# Patient Record
Sex: Male | Born: 1935 | Race: White | Hispanic: No | Marital: Married
Health system: Southern US, Community
[De-identification: ages and names within clinical notes are randomized; demographics above are authoritative.]

---

## 2004-12-05 ENCOUNTER — Inpatient Hospital Stay: Payer: Self-pay | Admitting: Internal Medicine

## 2004-12-05 ENCOUNTER — Other Ambulatory Visit: Payer: Self-pay

## 2005-01-28 ENCOUNTER — Ambulatory Visit: Payer: Self-pay | Admitting: Internal Medicine

## 2005-02-06 ENCOUNTER — Inpatient Hospital Stay: Payer: Self-pay | Admitting: Internal Medicine

## 2006-04-10 ENCOUNTER — Ambulatory Visit: Payer: Self-pay | Admitting: Internal Medicine

## 2006-10-29 ENCOUNTER — Ambulatory Visit: Payer: Self-pay | Admitting: Specialist

## 2007-05-06 ENCOUNTER — Ambulatory Visit: Payer: Self-pay | Admitting: Gastroenterology

## 2011-01-07 ENCOUNTER — Ambulatory Visit: Payer: Self-pay | Admitting: Internal Medicine

## 2011-01-14 ENCOUNTER — Emergency Department: Payer: Self-pay | Admitting: Emergency Medicine

## 2011-01-20 ENCOUNTER — Ambulatory Visit: Payer: Self-pay | Admitting: Unknown Physician Specialty

## 2011-02-18 ENCOUNTER — Ambulatory Visit: Payer: Self-pay | Admitting: Pain Medicine

## 2011-02-26 ENCOUNTER — Ambulatory Visit: Payer: Self-pay | Admitting: Pain Medicine

## 2011-03-11 ENCOUNTER — Ambulatory Visit: Payer: Self-pay | Admitting: Pain Medicine

## 2011-03-19 ENCOUNTER — Ambulatory Visit: Payer: Self-pay | Admitting: Pain Medicine

## 2011-03-28 ENCOUNTER — Inpatient Hospital Stay: Payer: Self-pay | Admitting: Specialist

## 2011-04-03 ENCOUNTER — Ambulatory Visit: Payer: Self-pay | Admitting: Pain Medicine

## 2011-05-01 ENCOUNTER — Ambulatory Visit: Payer: Self-pay | Admitting: Internal Medicine

## 2011-05-21 ENCOUNTER — Ambulatory Visit: Payer: Self-pay | Admitting: Internal Medicine

## 2011-05-24 ENCOUNTER — Ambulatory Visit: Payer: Self-pay | Admitting: Unknown Physician Specialty

## 2013-06-29 ENCOUNTER — Ambulatory Visit: Payer: Self-pay

## 2013-07-08 IMAGING — CR DG CHEST 2V
1 series · 4 of 4 positions shown · non-contrast
Comparison: none

REASON FOR EXAM: shortness of breath
COMMENTS:

[Series 1: pa · 0.17mm/px · 4 of 4 slices shown]
[im 1/4]
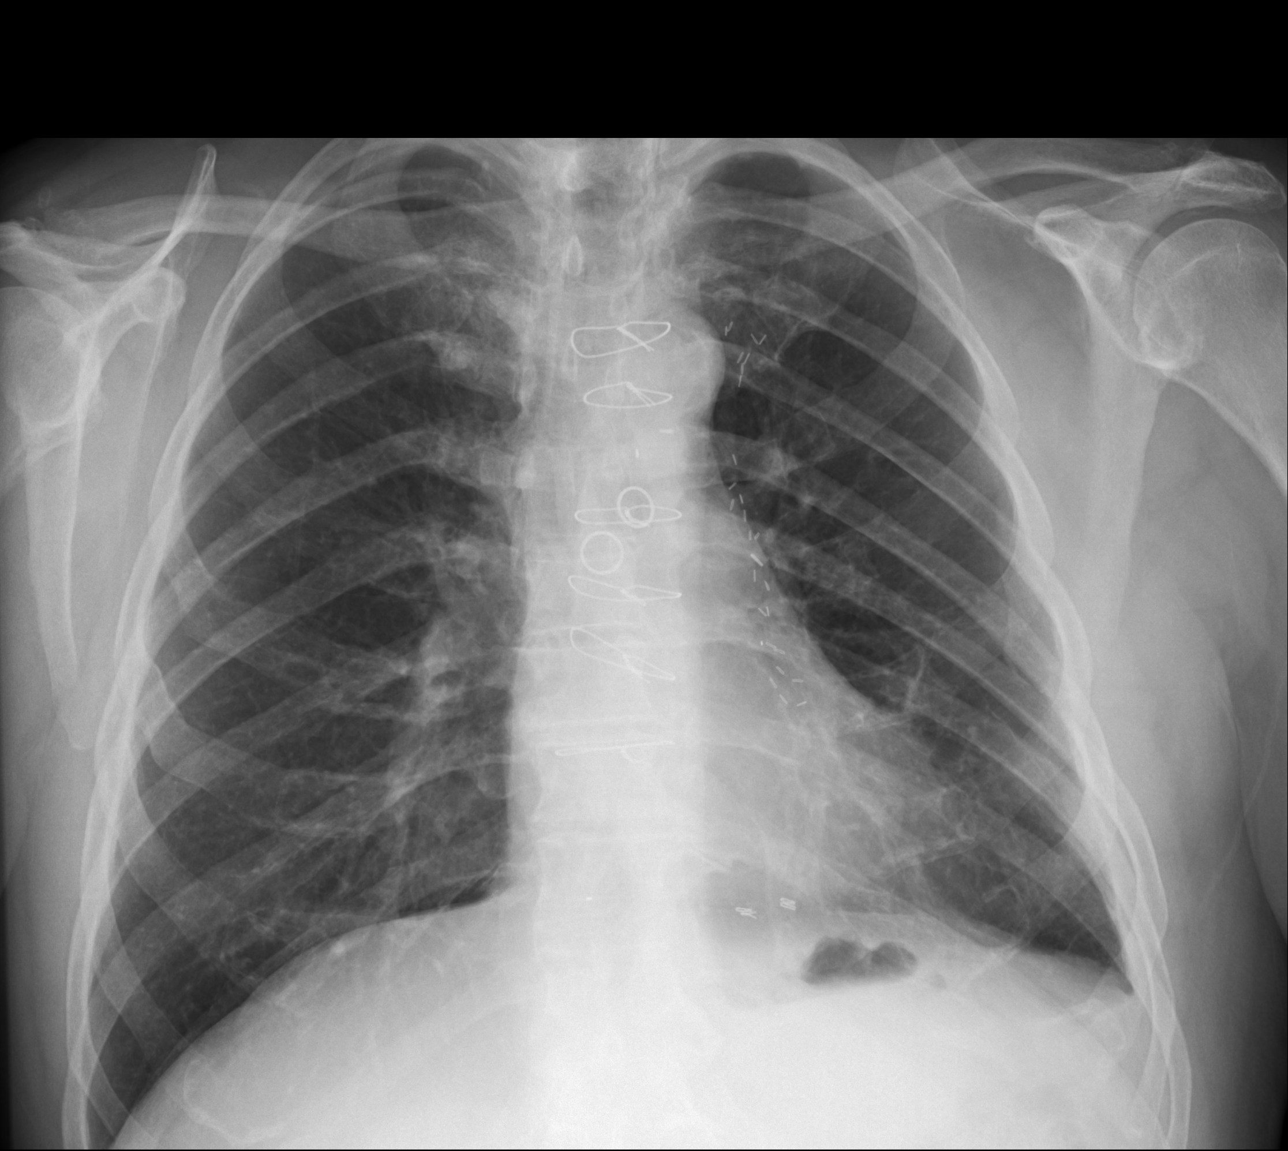
[im 2/4]
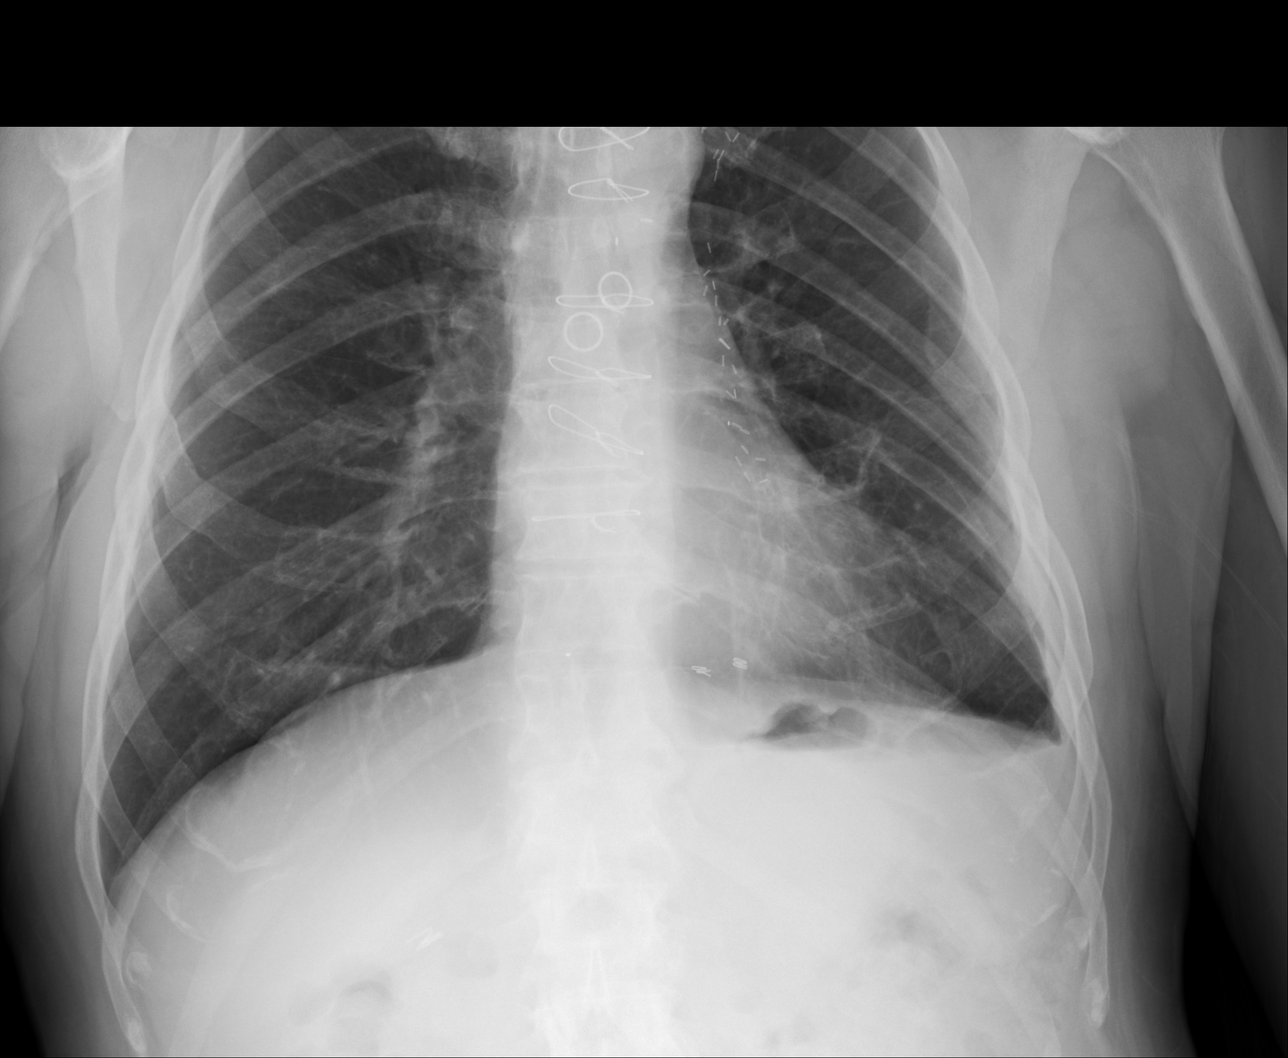
[im 3/4]
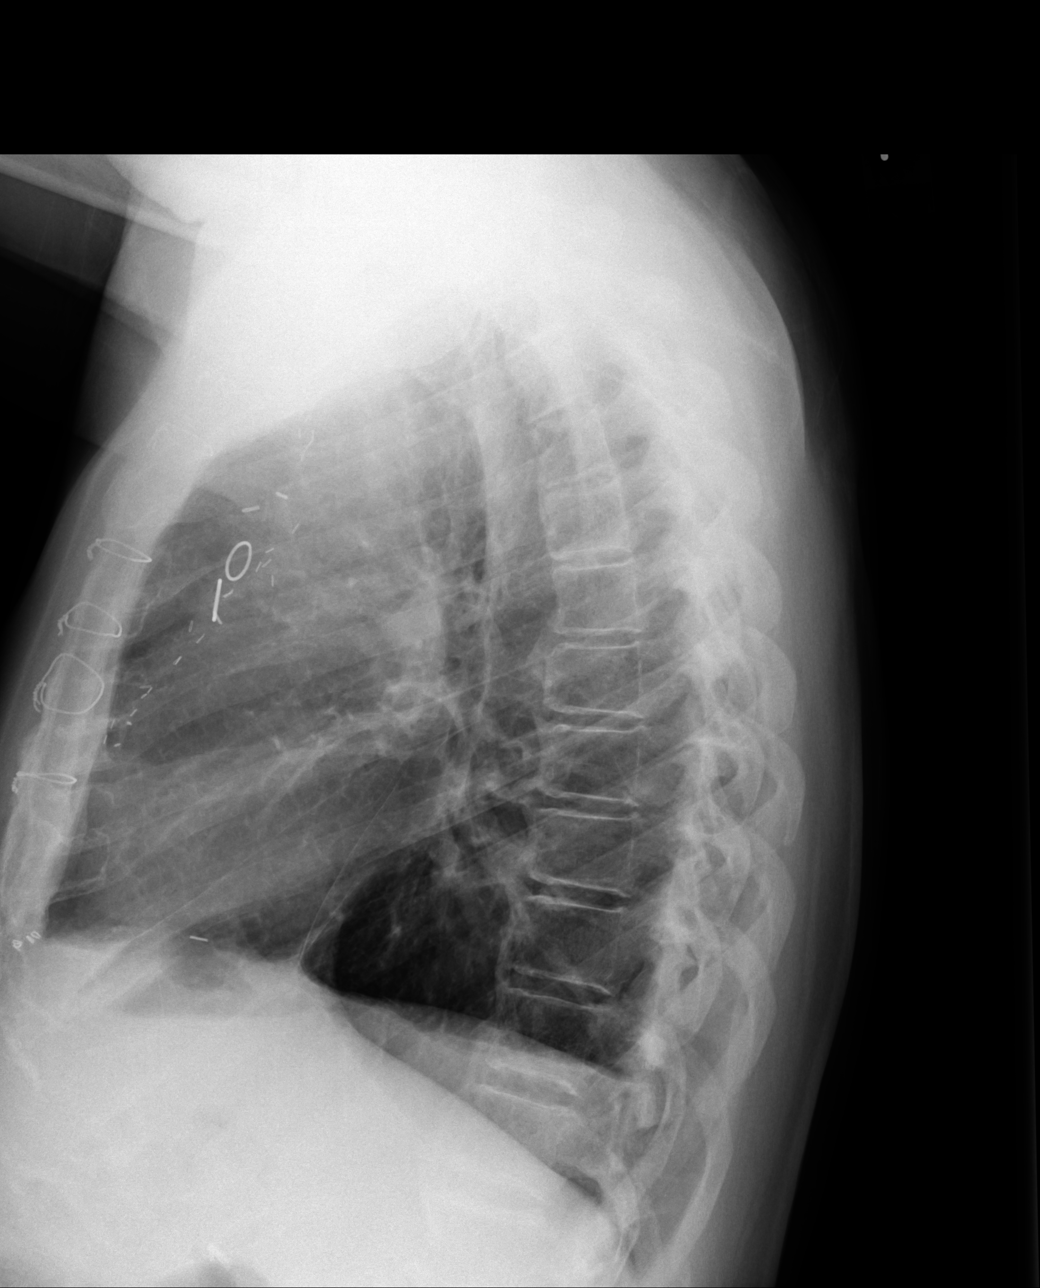
[im 4/4]
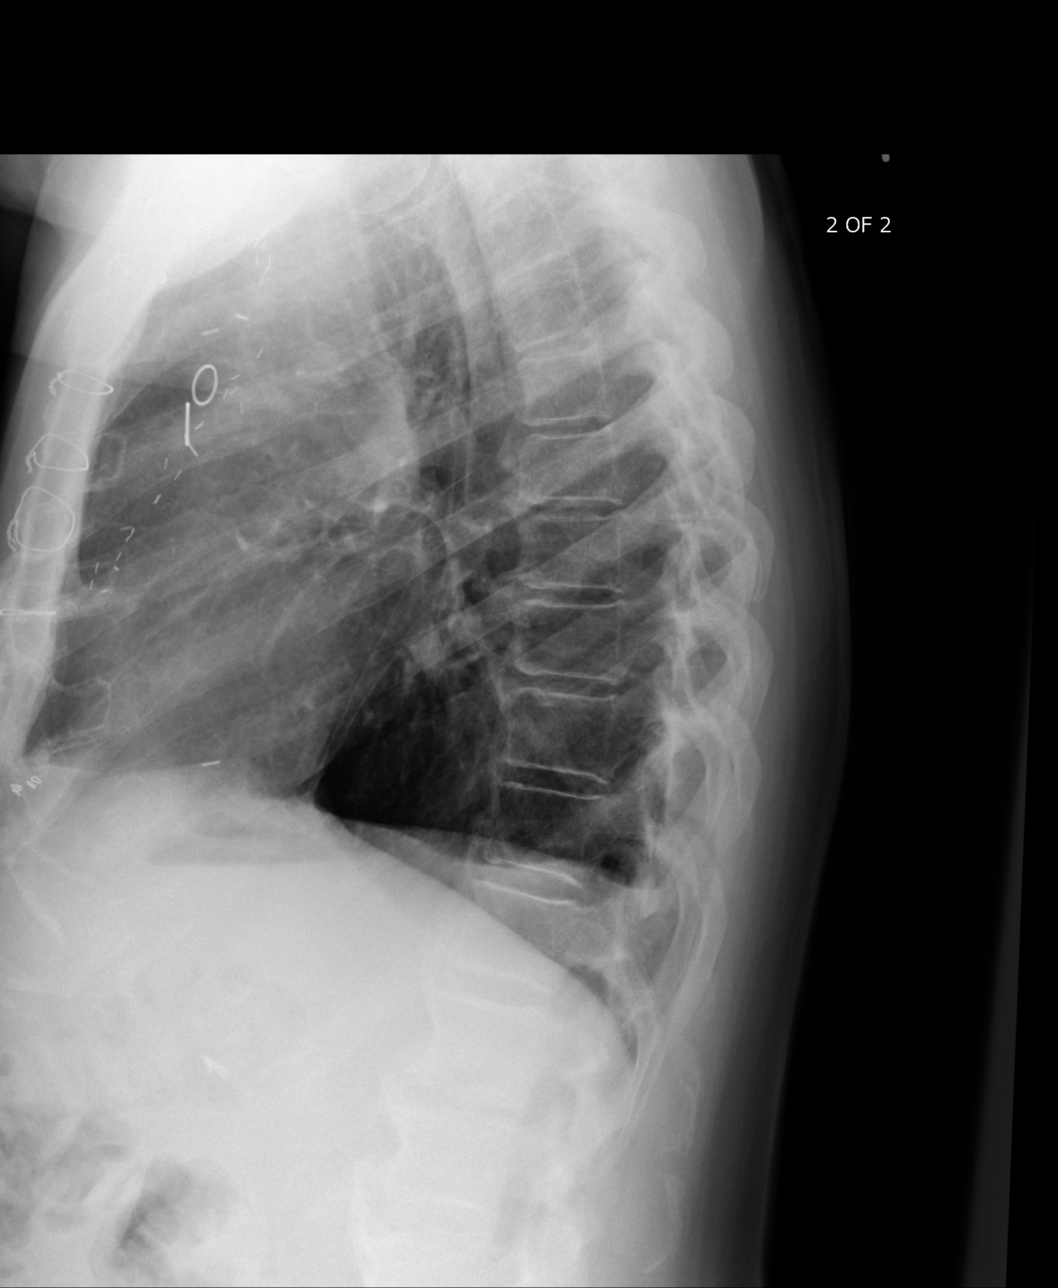

[4 of 4 positions shown; findings below may reference images not displayed]

PROCEDURE:     DXR - DXR CHEST PA (OR AP) AND LATERAL  - June 29, 2013  [DATE]

RESULT:     Comparison is made to a study dated May 21, 2011.

The right lung is well-expanded and clear. On the left there is stable
mildly increased density likely related to post CABG changes. There is no
significant pleural fluid collection. The cardiac silhouette is normal in
size. The mediastinum is normal in width. The pulmonary vascularity is not
engorged. There is no pneumothorax or pneumomediastinum.
IMPRESSION: The findings are consistent with COPD and post CABG
changes. There is no evidence of pneumonia.

[REDACTED]

## 2016-02-23 DIAGNOSIS — E1169 Type 2 diabetes mellitus with other specified complication: Secondary | ICD-10-CM | POA: Diagnosis not present

## 2016-02-23 DIAGNOSIS — Z0000 Encounter for general adult medical examination without abnormal findings: Secondary | ICD-10-CM | POA: Diagnosis not present

## 2016-02-23 DIAGNOSIS — E1140 Type 2 diabetes mellitus with diabetic neuropathy, unspecified: Secondary | ICD-10-CM | POA: Diagnosis not present

## 2016-02-23 DIAGNOSIS — K219 Gastro-esophageal reflux disease without esophagitis: Secondary | ICD-10-CM | POA: Diagnosis not present

## 2016-02-23 DIAGNOSIS — E039 Hypothyroidism, unspecified: Secondary | ICD-10-CM | POA: Diagnosis not present

## 2016-02-23 DIAGNOSIS — I25119 Atherosclerotic heart disease of native coronary artery with unspecified angina pectoris: Secondary | ICD-10-CM | POA: Diagnosis not present

## 2016-02-23 DIAGNOSIS — E782 Mixed hyperlipidemia: Secondary | ICD-10-CM | POA: Diagnosis not present

## 2016-02-23 DIAGNOSIS — F419 Anxiety disorder, unspecified: Secondary | ICD-10-CM | POA: Diagnosis not present

## 2016-02-23 DIAGNOSIS — I361 Nonrheumatic tricuspid (valve) insufficiency: Secondary | ICD-10-CM | POA: Diagnosis not present

## 2016-02-23 DIAGNOSIS — I4519 Other right bundle-branch block: Secondary | ICD-10-CM | POA: Diagnosis not present

## 2016-02-23 DIAGNOSIS — E1122 Type 2 diabetes mellitus with diabetic chronic kidney disease: Secondary | ICD-10-CM | POA: Diagnosis not present

## 2016-02-23 DIAGNOSIS — H409 Unspecified glaucoma: Secondary | ICD-10-CM | POA: Diagnosis not present

## 2016-02-23 DIAGNOSIS — N189 Chronic kidney disease, unspecified: Secondary | ICD-10-CM | POA: Diagnosis not present

## 2016-02-23 DIAGNOSIS — E1165 Type 2 diabetes mellitus with hyperglycemia: Secondary | ICD-10-CM | POA: Diagnosis not present

## 2016-02-23 DIAGNOSIS — E1151 Type 2 diabetes mellitus with diabetic peripheral angiopathy without gangrene: Secondary | ICD-10-CM | POA: Diagnosis not present

## 2018-11-11 DIAGNOSIS — R079 Chest pain, unspecified: Secondary | ICD-10-CM | POA: Diagnosis not present

## 2019-03-17 DIAGNOSIS — I959 Hypotension, unspecified: Secondary | ICD-10-CM | POA: Diagnosis not present

## 2019-03-17 DIAGNOSIS — R5381 Other malaise: Secondary | ICD-10-CM | POA: Diagnosis not present

## 2019-03-17 DIAGNOSIS — R52 Pain, unspecified: Secondary | ICD-10-CM | POA: Diagnosis not present

## 2019-03-17 DIAGNOSIS — R42 Dizziness and giddiness: Secondary | ICD-10-CM | POA: Diagnosis not present

## 2019-03-17 DIAGNOSIS — R069 Unspecified abnormalities of breathing: Secondary | ICD-10-CM | POA: Diagnosis not present

## 2019-04-14 DIAGNOSIS — R079 Chest pain, unspecified: Secondary | ICD-10-CM | POA: Diagnosis not present

## 2019-04-14 DIAGNOSIS — I959 Hypotension, unspecified: Secondary | ICD-10-CM | POA: Diagnosis not present

## 2019-04-14 DIAGNOSIS — R9431 Abnormal electrocardiogram [ECG] [EKG]: Secondary | ICD-10-CM | POA: Diagnosis not present

## 2019-04-14 DIAGNOSIS — E1165 Type 2 diabetes mellitus with hyperglycemia: Secondary | ICD-10-CM | POA: Diagnosis not present

## 2019-07-21 DIAGNOSIS — E1151 Type 2 diabetes mellitus with diabetic peripheral angiopathy without gangrene: Secondary | ICD-10-CM | POA: Diagnosis not present

## 2019-07-21 DIAGNOSIS — I130 Hypertensive heart and chronic kidney disease with heart failure and stage 1 through stage 4 chronic kidney disease, or unspecified chronic kidney disease: Secondary | ICD-10-CM | POA: Diagnosis not present

## 2019-07-21 DIAGNOSIS — M1612 Unilateral primary osteoarthritis, left hip: Secondary | ICD-10-CM | POA: Diagnosis not present

## 2019-07-21 DIAGNOSIS — E1122 Type 2 diabetes mellitus with diabetic chronic kidney disease: Secondary | ICD-10-CM | POA: Diagnosis not present

## 2019-07-21 DIAGNOSIS — N183 Chronic kidney disease, stage 3 (moderate): Secondary | ICD-10-CM | POA: Diagnosis not present

## 2019-07-21 DIAGNOSIS — I480 Paroxysmal atrial fibrillation: Secondary | ICD-10-CM | POA: Diagnosis not present

## 2019-07-21 DIAGNOSIS — I5030 Unspecified diastolic (congestive) heart failure: Secondary | ICD-10-CM | POA: Diagnosis not present

## 2019-07-21 DIAGNOSIS — D469 Myelodysplastic syndrome, unspecified: Secondary | ICD-10-CM | POA: Diagnosis not present

## 2019-07-21 DIAGNOSIS — I2510 Atherosclerotic heart disease of native coronary artery without angina pectoris: Secondary | ICD-10-CM | POA: Diagnosis not present

## 2019-07-23 DIAGNOSIS — I5030 Unspecified diastolic (congestive) heart failure: Secondary | ICD-10-CM | POA: Diagnosis not present

## 2019-07-23 DIAGNOSIS — I130 Hypertensive heart and chronic kidney disease with heart failure and stage 1 through stage 4 chronic kidney disease, or unspecified chronic kidney disease: Secondary | ICD-10-CM | POA: Diagnosis not present

## 2019-07-23 DIAGNOSIS — D469 Myelodysplastic syndrome, unspecified: Secondary | ICD-10-CM | POA: Diagnosis not present

## 2019-07-23 DIAGNOSIS — I480 Paroxysmal atrial fibrillation: Secondary | ICD-10-CM | POA: Diagnosis not present

## 2019-07-23 DIAGNOSIS — N183 Chronic kidney disease, stage 3 (moderate): Secondary | ICD-10-CM | POA: Diagnosis not present

## 2019-07-23 DIAGNOSIS — M1612 Unilateral primary osteoarthritis, left hip: Secondary | ICD-10-CM | POA: Diagnosis not present

## 2019-07-23 DIAGNOSIS — I2510 Atherosclerotic heart disease of native coronary artery without angina pectoris: Secondary | ICD-10-CM | POA: Diagnosis not present

## 2019-07-23 DIAGNOSIS — E1151 Type 2 diabetes mellitus with diabetic peripheral angiopathy without gangrene: Secondary | ICD-10-CM | POA: Diagnosis not present

## 2019-07-23 DIAGNOSIS — E1122 Type 2 diabetes mellitus with diabetic chronic kidney disease: Secondary | ICD-10-CM | POA: Diagnosis not present

## 2019-07-26 DIAGNOSIS — D469 Myelodysplastic syndrome, unspecified: Secondary | ICD-10-CM | POA: Diagnosis not present

## 2019-07-26 DIAGNOSIS — I480 Paroxysmal atrial fibrillation: Secondary | ICD-10-CM | POA: Diagnosis not present

## 2019-07-26 DIAGNOSIS — I5030 Unspecified diastolic (congestive) heart failure: Secondary | ICD-10-CM | POA: Diagnosis not present

## 2019-07-26 DIAGNOSIS — E1122 Type 2 diabetes mellitus with diabetic chronic kidney disease: Secondary | ICD-10-CM | POA: Diagnosis not present

## 2019-07-26 DIAGNOSIS — I2510 Atherosclerotic heart disease of native coronary artery without angina pectoris: Secondary | ICD-10-CM | POA: Diagnosis not present

## 2019-07-26 DIAGNOSIS — M1612 Unilateral primary osteoarthritis, left hip: Secondary | ICD-10-CM | POA: Diagnosis not present

## 2019-07-26 DIAGNOSIS — N183 Chronic kidney disease, stage 3 (moderate): Secondary | ICD-10-CM | POA: Diagnosis not present

## 2019-07-26 DIAGNOSIS — E1151 Type 2 diabetes mellitus with diabetic peripheral angiopathy without gangrene: Secondary | ICD-10-CM | POA: Diagnosis not present

## 2019-07-26 DIAGNOSIS — I130 Hypertensive heart and chronic kidney disease with heart failure and stage 1 through stage 4 chronic kidney disease, or unspecified chronic kidney disease: Secondary | ICD-10-CM | POA: Diagnosis not present

## 2019-08-06 DIAGNOSIS — I2510 Atherosclerotic heart disease of native coronary artery without angina pectoris: Secondary | ICD-10-CM | POA: Diagnosis not present

## 2019-08-06 DIAGNOSIS — M1612 Unilateral primary osteoarthritis, left hip: Secondary | ICD-10-CM | POA: Diagnosis not present

## 2019-08-06 DIAGNOSIS — E1151 Type 2 diabetes mellitus with diabetic peripheral angiopathy without gangrene: Secondary | ICD-10-CM | POA: Diagnosis not present

## 2019-08-06 DIAGNOSIS — I130 Hypertensive heart and chronic kidney disease with heart failure and stage 1 through stage 4 chronic kidney disease, or unspecified chronic kidney disease: Secondary | ICD-10-CM | POA: Diagnosis not present

## 2019-08-06 DIAGNOSIS — I5030 Unspecified diastolic (congestive) heart failure: Secondary | ICD-10-CM | POA: Diagnosis not present

## 2019-08-06 DIAGNOSIS — E1122 Type 2 diabetes mellitus with diabetic chronic kidney disease: Secondary | ICD-10-CM | POA: Diagnosis not present

## 2019-08-06 DIAGNOSIS — D469 Myelodysplastic syndrome, unspecified: Secondary | ICD-10-CM | POA: Diagnosis not present

## 2019-08-06 DIAGNOSIS — N183 Chronic kidney disease, stage 3 (moderate): Secondary | ICD-10-CM | POA: Diagnosis not present

## 2019-08-06 DIAGNOSIS — I480 Paroxysmal atrial fibrillation: Secondary | ICD-10-CM | POA: Diagnosis not present

## 2019-08-10 DIAGNOSIS — D469 Myelodysplastic syndrome, unspecified: Secondary | ICD-10-CM | POA: Diagnosis not present

## 2019-08-10 DIAGNOSIS — M1612 Unilateral primary osteoarthritis, left hip: Secondary | ICD-10-CM | POA: Diagnosis not present

## 2019-08-10 DIAGNOSIS — I130 Hypertensive heart and chronic kidney disease with heart failure and stage 1 through stage 4 chronic kidney disease, or unspecified chronic kidney disease: Secondary | ICD-10-CM | POA: Diagnosis not present

## 2019-08-10 DIAGNOSIS — I480 Paroxysmal atrial fibrillation: Secondary | ICD-10-CM | POA: Diagnosis not present

## 2019-08-10 DIAGNOSIS — E1151 Type 2 diabetes mellitus with diabetic peripheral angiopathy without gangrene: Secondary | ICD-10-CM | POA: Diagnosis not present

## 2019-08-10 DIAGNOSIS — I2510 Atherosclerotic heart disease of native coronary artery without angina pectoris: Secondary | ICD-10-CM | POA: Diagnosis not present

## 2019-08-10 DIAGNOSIS — I5030 Unspecified diastolic (congestive) heart failure: Secondary | ICD-10-CM | POA: Diagnosis not present

## 2019-08-10 DIAGNOSIS — N183 Chronic kidney disease, stage 3 (moderate): Secondary | ICD-10-CM | POA: Diagnosis not present

## 2019-08-10 DIAGNOSIS — E1122 Type 2 diabetes mellitus with diabetic chronic kidney disease: Secondary | ICD-10-CM | POA: Diagnosis not present

## 2019-08-12 DIAGNOSIS — E1151 Type 2 diabetes mellitus with diabetic peripheral angiopathy without gangrene: Secondary | ICD-10-CM | POA: Diagnosis not present

## 2019-08-12 DIAGNOSIS — E1122 Type 2 diabetes mellitus with diabetic chronic kidney disease: Secondary | ICD-10-CM | POA: Diagnosis not present

## 2019-08-12 DIAGNOSIS — I2510 Atherosclerotic heart disease of native coronary artery without angina pectoris: Secondary | ICD-10-CM | POA: Diagnosis not present

## 2019-08-12 DIAGNOSIS — M1612 Unilateral primary osteoarthritis, left hip: Secondary | ICD-10-CM | POA: Diagnosis not present

## 2019-08-12 DIAGNOSIS — N183 Chronic kidney disease, stage 3 (moderate): Secondary | ICD-10-CM | POA: Diagnosis not present

## 2019-08-12 DIAGNOSIS — I5030 Unspecified diastolic (congestive) heart failure: Secondary | ICD-10-CM | POA: Diagnosis not present

## 2019-08-12 DIAGNOSIS — I130 Hypertensive heart and chronic kidney disease with heart failure and stage 1 through stage 4 chronic kidney disease, or unspecified chronic kidney disease: Secondary | ICD-10-CM | POA: Diagnosis not present

## 2019-08-12 DIAGNOSIS — D469 Myelodysplastic syndrome, unspecified: Secondary | ICD-10-CM | POA: Diagnosis not present

## 2019-08-12 DIAGNOSIS — I480 Paroxysmal atrial fibrillation: Secondary | ICD-10-CM | POA: Diagnosis not present

## 2019-08-20 DIAGNOSIS — I480 Paroxysmal atrial fibrillation: Secondary | ICD-10-CM | POA: Diagnosis not present

## 2019-08-20 DIAGNOSIS — I5030 Unspecified diastolic (congestive) heart failure: Secondary | ICD-10-CM | POA: Diagnosis not present

## 2019-08-20 DIAGNOSIS — N183 Chronic kidney disease, stage 3 (moderate): Secondary | ICD-10-CM | POA: Diagnosis not present

## 2019-08-20 DIAGNOSIS — M1612 Unilateral primary osteoarthritis, left hip: Secondary | ICD-10-CM | POA: Diagnosis not present

## 2019-08-20 DIAGNOSIS — E1122 Type 2 diabetes mellitus with diabetic chronic kidney disease: Secondary | ICD-10-CM | POA: Diagnosis not present

## 2019-08-20 DIAGNOSIS — E1151 Type 2 diabetes mellitus with diabetic peripheral angiopathy without gangrene: Secondary | ICD-10-CM | POA: Diagnosis not present

## 2019-08-20 DIAGNOSIS — D469 Myelodysplastic syndrome, unspecified: Secondary | ICD-10-CM | POA: Diagnosis not present

## 2019-08-20 DIAGNOSIS — I2510 Atherosclerotic heart disease of native coronary artery without angina pectoris: Secondary | ICD-10-CM | POA: Diagnosis not present

## 2019-08-20 DIAGNOSIS — I130 Hypertensive heart and chronic kidney disease with heart failure and stage 1 through stage 4 chronic kidney disease, or unspecified chronic kidney disease: Secondary | ICD-10-CM | POA: Diagnosis not present

## 2019-08-24 DIAGNOSIS — D469 Myelodysplastic syndrome, unspecified: Secondary | ICD-10-CM | POA: Diagnosis not present

## 2019-08-24 DIAGNOSIS — M1612 Unilateral primary osteoarthritis, left hip: Secondary | ICD-10-CM | POA: Diagnosis not present

## 2019-08-24 DIAGNOSIS — E1122 Type 2 diabetes mellitus with diabetic chronic kidney disease: Secondary | ICD-10-CM | POA: Diagnosis not present

## 2019-08-24 DIAGNOSIS — I130 Hypertensive heart and chronic kidney disease with heart failure and stage 1 through stage 4 chronic kidney disease, or unspecified chronic kidney disease: Secondary | ICD-10-CM | POA: Diagnosis not present

## 2019-08-24 DIAGNOSIS — N183 Chronic kidney disease, stage 3 (moderate): Secondary | ICD-10-CM | POA: Diagnosis not present

## 2019-08-24 DIAGNOSIS — I480 Paroxysmal atrial fibrillation: Secondary | ICD-10-CM | POA: Diagnosis not present

## 2019-08-24 DIAGNOSIS — E1151 Type 2 diabetes mellitus with diabetic peripheral angiopathy without gangrene: Secondary | ICD-10-CM | POA: Diagnosis not present

## 2019-08-24 DIAGNOSIS — I5030 Unspecified diastolic (congestive) heart failure: Secondary | ICD-10-CM | POA: Diagnosis not present

## 2019-08-24 DIAGNOSIS — I2510 Atherosclerotic heart disease of native coronary artery without angina pectoris: Secondary | ICD-10-CM | POA: Diagnosis not present

## 2020-02-15 ENCOUNTER — Other Ambulatory Visit: Payer: Self-pay

## 2020-02-16 ENCOUNTER — Ambulatory Visit
Admission: RE | Admit: 2020-02-16 | Discharge: 2020-02-16 | Disposition: A | Discharge status: Home / Self Care | Payer: Self-pay | Source: Ambulatory Visit | Attending: Radiation Oncology | Admitting: Radiation Oncology

## 2020-02-16 ENCOUNTER — Other Ambulatory Visit: Payer: Self-pay | Admitting: Radiation Oncology

## 2020-02-16 ENCOUNTER — Ambulatory Visit
Admission: RE | Admit: 2020-02-16 | Discharge: 2020-02-16 | Disposition: A | Discharge status: Home / Self Care | Payer: No Typology Code available for payment source | Source: Ambulatory Visit | Attending: Radiation Oncology | Admitting: Radiation Oncology

## 2020-02-16 ENCOUNTER — Other Ambulatory Visit: Payer: Self-pay

## 2020-02-16 DIAGNOSIS — D2320 Other benign neoplasm of skin of unspecified ear and external auricular canal: Secondary | ICD-10-CM

## 2020-02-16 DIAGNOSIS — C44222 Squamous cell carcinoma of skin of right ear and external auricular canal: Secondary | ICD-10-CM | POA: Diagnosis present

## 2020-02-16 DIAGNOSIS — C4492 Squamous cell carcinoma of skin, unspecified: Secondary | ICD-10-CM

## 2020-02-16 DIAGNOSIS — D469 Myelodysplastic syndrome, unspecified: Secondary | ICD-10-CM | POA: Diagnosis not present

## 2020-02-16 DIAGNOSIS — E11319 Type 2 diabetes mellitus with unspecified diabetic retinopathy without macular edema: Secondary | ICD-10-CM | POA: Insufficient documentation

## 2020-02-16 DIAGNOSIS — Z7982 Long term (current) use of aspirin: Secondary | ICD-10-CM | POA: Insufficient documentation

## 2020-02-16 DIAGNOSIS — D631 Anemia in chronic kidney disease: Secondary | ICD-10-CM | POA: Insufficient documentation

## 2020-02-16 DIAGNOSIS — J449 Chronic obstructive pulmonary disease, unspecified: Secondary | ICD-10-CM | POA: Insufficient documentation

## 2020-02-16 DIAGNOSIS — Z794 Long term (current) use of insulin: Secondary | ICD-10-CM | POA: Diagnosis not present

## 2020-02-16 DIAGNOSIS — N183 Chronic kidney disease, stage 3 unspecified: Secondary | ICD-10-CM | POA: Diagnosis not present

## 2020-02-16 DIAGNOSIS — Z79899 Other long term (current) drug therapy: Secondary | ICD-10-CM | POA: Diagnosis not present

## 2020-02-16 DIAGNOSIS — E1122 Type 2 diabetes mellitus with diabetic chronic kidney disease: Secondary | ICD-10-CM | POA: Diagnosis not present

## 2020-02-16 DIAGNOSIS — I509 Heart failure, unspecified: Secondary | ICD-10-CM | POA: Insufficient documentation

## 2020-02-16 DIAGNOSIS — K219 Gastro-esophageal reflux disease without esophagitis: Secondary | ICD-10-CM | POA: Insufficient documentation

## 2020-02-16 DIAGNOSIS — I214 Non-ST elevation (NSTEMI) myocardial infarction: Secondary | ICD-10-CM | POA: Insufficient documentation

## 2020-02-16 DIAGNOSIS — I5032 Chronic diastolic (congestive) heart failure: Secondary | ICD-10-CM | POA: Insufficient documentation

## 2020-02-16 DIAGNOSIS — D649 Anemia, unspecified: Secondary | ICD-10-CM | POA: Insufficient documentation

## 2020-02-16 DIAGNOSIS — K222 Esophageal obstruction: Secondary | ICD-10-CM | POA: Insufficient documentation

## 2020-02-16 DIAGNOSIS — E119 Type 2 diabetes mellitus without complications: Secondary | ICD-10-CM | POA: Insufficient documentation

## 2020-02-16 DIAGNOSIS — E039 Hypothyroidism, unspecified: Secondary | ICD-10-CM | POA: Insufficient documentation

## 2020-02-16 DIAGNOSIS — I10 Essential (primary) hypertension: Secondary | ICD-10-CM | POA: Insufficient documentation

## 2020-02-16 DIAGNOSIS — G250 Essential tremor: Secondary | ICD-10-CM | POA: Insufficient documentation

## 2020-02-16 DIAGNOSIS — E782 Mixed hyperlipidemia: Secondary | ICD-10-CM | POA: Insufficient documentation

## 2020-02-16 NOTE — Consult Note (Signed)
NEW PATIENT EVALUATION  Name: Justin Schultz  MRN: [id number]  Date:   02/16/2020     DOB: [birth year]   This 86 y.o. 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  REFERRING PHYSICIAN: [place]  CHIEF COMPLAINT:  Chief Complaint  Patient presents with  . Cancer    Initial consultation    DIAGNOSIS: The encounter diagnosis was Squamous cell carcinoma of skin.   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  CT scan also shows a nodular infiltrating appearing right preauricular skin lesion measuring 1.6 cm which is the squamous cell carcinoma.  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  [facility] and is [facility] today for consideration of treatment to his right preauricular lesion.  He has multiple comorbidities including MDS anemia of chronic disease congestive heart failure stage III chronic renal disease COPD esophageal stricture and adult onset diabetes.  PLANNED TREATMENT REGIMEN: Electron-beam therapy to her right ear  PAST MEDICAL HISTORY:  has no past medical history on file.    PAST SURGICAL HISTORY:   FAMILY HISTORY: family history is not on file.  SOCIAL HISTORY:    ALLERGIES: Patient has no allergy information on record.  MEDICATIONS:  Current Outpatient  Medications  Medication Sig Dispense Refill  . acetaminophen (TYLENOL) 325 MG tablet acetaminophen oral 325             mg 2 tablet EVERY 6 HOURS PRN PAIN    . albuterol (VENTOLIN HFA) 108 (90 Base) MCG/ACT inhaler albuterol sulfate inhalation 2 PUFFS EVERY 6 HOURS AS NEEDED FOR DYSPNEA DYSPNEA    . ALDACTONE 25 MG tablet spironolactone oral 25              mg 1 tablet DAILY EDEMA    . aspirin 81 MG EC tablet Take 81 mg by mouth daily.    Marland Kitchen atorvastatin (LIPITOR) 40 MG tablet Take by mouth.    . cetirizine (ZYRTEC) 10 MG tablet cetirizine oral 10              mg 1 tablet BEDTIME ALLERGIES    . COLACE 100 MG capsule docusate sodium oral 100             mg 2 capsule DAILY CONSTIPATION    . LANTUS 100 UNIT/ML injection insulin glargine subcutaneous 100             unit/mL 60 unit DAILY DIABETES    . levothyroxine (SYNTHROID) 100 MCG tablet Take by mouth.    . Magnesium Oxide -Mg Supplement 400 MG CAPS magnesium oxide oral 400             mg magnesium 1 tablet 2 TIMES DAILY SUPPLEMENT    . metoprolol succinate (TOPROL-XL) 50 MG 24 hr tablet metoprolol succinate oral 50  mg 0.5 tablet DAILY HEART DISEASE    . mirtazapine (REMERON) 7.5 MG tablet mirtazapine oral 7.5             mg 1 tablet BEDTIME DEPRESSION    . NITROSTAT 0.4 MG SL tablet nitroglycerin sublingual PRN CHEST PAIN    . NOVOLIN R 100 UNIT/ML injection insulin regular human injection 100             unit/mL 5 unit BEFORE MEALS DIABETES    . omeprazole (PRILOSEC) 20 MG capsule Take by mouth.    . potassium chloride SA (KLOR-CON) 20 MEQ tablet potassium chloride oral 20              mEq 1 tablet DAILY SUPPLEMENT    . PROCRIT 24401 UNIT/ML injection epoetin alfa injection 40,000          unit/mL 40000 unit 2 TIMES A WEEK ANEMIA    . torsemide (DEMADEX) 20 MG tablet torsemide oral 20              mg 2 tablet 2 TIMES DAILY EDEMA    . vitamin B-12 (CYANOCOBALAMIN) 1000 MCG tablet cyanocobalamin (vitamin B-12) oral 1,000           mcg  1 tablet DAILY SUPPLEMENT    . VITAMIN D-1000 MAX ST 25 MCG (1000 UT) tablet cholecalciferol (vitamin D3) oral 25 mcg          (1,000 unit) 1 tablet DAILY SUPPLEMENT     No current facility-administered medications for this encounter.    ECOG PERFORMANCE STATUS:  0 - Asymptomatic  REVIEW OF SYSTEMS: Patient denies any weight loss, fatigue, weakness, fever, chills or night sweats. Patient denies any loss of vision, blurred vision. Patient denies any ringing  of the ears or hearing loss. No irregular heartbeat. Patient denies heart murmur or history of fainting. Patient denies any chest pain or pain radiating to her upper extremities. Patient denies any shortness of breath, difficulty breathing at night, cough or hemoptysis. Patient denies any swelling in the lower legs. Patient denies any nausea vomiting, vomiting of blood, or coffee ground material in the vomitus. Patient denies any stomach pain. Patient states has had normal bowel movements no significant constipation or diarrhea. Patient denies any dysuria, hematuria or significant nocturia. Patient denies any problems walking, swelling in the joints or loss of balance. Patient denies any skin changes, loss of hair or loss of weight. Patient denies any excessive worrying or anxiety or significant depression. Patient denies any problems with insomnia. Patient denies excessive thirst, polyuria, polydipsia. Patient denies any swollen glands, patient denies easy bruising or easy bleeding. Patient denies any recent infections, allergies or URI. Patient "s visual fields have not changed significantly in recent time.   PHYSICAL EXAM: BP (!) 161/59 (BP Location: Left Arm, Patient Position: Sitting)   Pulse 60   Temp (!) 96.4 F (35.8 C) (Tympanic)   Resp (!) 22  Patient has a firm fixed lesion of the right preauricular skin of the right ear.  No evidence of cervical or supraclavicular adenopathy is identified.  Well-developed well-nourished patient in  NAD. HEENT reveals PERLA, EOMI, discs not visualized.  Oral cavity is clear. No oral mucosal lesions are identified. Neck is clear without evidence of cervical or supraclavicular adenopathy. Lungs are clear to A&P. Cardiac examination is essentially unremarkable with regular rate and rhythm without murmur rub or thrill. Abdomen is benign with no organomegaly or masses noted. Motor sensory and DTR levels are equal and  symmetric in the upper and lower extremities. Cranial nerves II through XII are grossly intact. Proprioception is intact. No peripheral adenopathy or edema is identified. No motor or sensory levels are noted. Crude visual fields are within normal range.  LABORATORY DATA: Pathology report reviewed    RADIOLOGY RESULTS: CT scan of the head and neck region reviewed compatible with above-stated findings   IMPRESSION: Squamous cell carcinoma of the right ear in 86 year old male with significant comorbidities  PLAN: This time family is requesting radiation therapy to his right ear rather than surgical excision.  There is also the question of the nasopharyngeal lesion although this is asymptomatic at this time.  I would like to go ahead with 3000 cGy in 10 fractions to his right ear and evaluate for response.  I believe with his multiple comorbidities this to be sufficient dose to control any further progression of the squamous cell carcinoma of the skin.  I also would save palliative radiation therapy to his nasopharynx should he develop symptoms such as nasal bleed obstruction or pain.  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  I personally set up and ordered CT simulation for next week.  I would like to take this opportunity to thank you for allowing me to participate in the care of your patient.Noreene Filbert, MD

## 2020-02-21 ENCOUNTER — Ambulatory Visit: Payer: No Typology Code available for payment source | Attending: Radiation Oncology

## 2020-02-21 ENCOUNTER — Other Ambulatory Visit: Payer: Self-pay

## 2020-02-21 DIAGNOSIS — C44222 Squamous cell carcinoma of skin of right ear and external auricular canal: Secondary | ICD-10-CM | POA: Diagnosis present

## 2020-02-22 DIAGNOSIS — C44222 Squamous cell carcinoma of skin of right ear and external auricular canal: Secondary | ICD-10-CM | POA: Diagnosis not present

## 2020-02-28 ENCOUNTER — Ambulatory Visit
Admission: RE | Admit: 2020-02-28 | Discharge: 2020-02-28 | Disposition: A | Discharge status: Home / Self Care | Payer: No Typology Code available for payment source | Source: Ambulatory Visit | Attending: Radiation Oncology | Admitting: Radiation Oncology

## 2020-02-28 ENCOUNTER — Other Ambulatory Visit: Payer: Self-pay

## 2020-02-28 ENCOUNTER — Other Ambulatory Visit: Payer: Self-pay | Admitting: *Deleted

## 2020-02-28 DIAGNOSIS — C4492 Squamous cell carcinoma of skin, unspecified: Secondary | ICD-10-CM

## 2020-02-28 DIAGNOSIS — C44222 Squamous cell carcinoma of skin of right ear and external auricular canal: Secondary | ICD-10-CM | POA: Insufficient documentation

## 2020-02-29 ENCOUNTER — Other Ambulatory Visit: Payer: Self-pay

## 2020-02-29 ENCOUNTER — Inpatient Hospital Stay: Payer: No Typology Code available for payment source | Attending: Radiation Oncology

## 2020-02-29 ENCOUNTER — Ambulatory Visit
Admission: RE | Admit: 2020-02-29 | Discharge: 2020-02-29 | Disposition: A | Discharge status: Home / Self Care | Payer: No Typology Code available for payment source | Source: Ambulatory Visit | Attending: Radiation Oncology | Admitting: Radiation Oncology

## 2020-02-29 DIAGNOSIS — C4492 Squamous cell carcinoma of skin, unspecified: Secondary | ICD-10-CM | POA: Diagnosis not present

## 2020-03-01 ENCOUNTER — Ambulatory Visit
Admission: RE | Admit: 2020-03-01 | Discharge: 2020-03-01 | Disposition: A | Discharge status: Home / Self Care | Payer: No Typology Code available for payment source | Source: Ambulatory Visit | Attending: Radiation Oncology | Admitting: Radiation Oncology

## 2020-03-01 ENCOUNTER — Inpatient Hospital Stay: Payer: No Typology Code available for payment source

## 2020-03-01 ENCOUNTER — Other Ambulatory Visit: Payer: Self-pay

## 2020-03-01 DIAGNOSIS — C4492 Squamous cell carcinoma of skin, unspecified: Secondary | ICD-10-CM | POA: Diagnosis not present

## 2020-03-02 ENCOUNTER — Inpatient Hospital Stay: Payer: No Typology Code available for payment source

## 2020-03-02 ENCOUNTER — Ambulatory Visit
Admission: RE | Admit: 2020-03-02 | Discharge: 2020-03-02 | Disposition: A | Discharge status: Home / Self Care | Payer: No Typology Code available for payment source | Source: Ambulatory Visit | Attending: Radiation Oncology | Admitting: Radiation Oncology

## 2020-03-02 ENCOUNTER — Other Ambulatory Visit: Payer: Self-pay

## 2020-03-02 DIAGNOSIS — C4492 Squamous cell carcinoma of skin, unspecified: Secondary | ICD-10-CM | POA: Diagnosis not present

## 2020-03-02 LAB — CBC
HCT: 30.4 % — ABNORMAL LOW (ref 39.0–52.0)
Hemoglobin: 9.3 g/dL — ABNORMAL LOW (ref 13.0–17.0)
MCH: 32.6 pg (ref 26.0–34.0)
MCHC: 30.6 g/dL (ref 30.0–36.0)
MCV: 106.7 fL — ABNORMAL HIGH (ref 80.0–100.0)
Platelets: 63 10*3/uL — ABNORMAL LOW (ref 150–400)
RBC: 2.85 MIL/uL — ABNORMAL LOW (ref 4.22–5.81)
RDW: 22.5 % — ABNORMAL HIGH (ref 11.5–15.5)
WBC: 3.3 10*3/uL — ABNORMAL LOW (ref 4.0–10.5)
nRBC: 0 % (ref 0.0–0.2)

## 2020-03-03 ENCOUNTER — Inpatient Hospital Stay: Payer: No Typology Code available for payment source

## 2020-03-03 ENCOUNTER — Other Ambulatory Visit: Payer: Self-pay

## 2020-03-03 ENCOUNTER — Ambulatory Visit
Admission: RE | Admit: 2020-03-03 | Discharge: 2020-03-03 | Disposition: A | Discharge status: Home / Self Care | Payer: No Typology Code available for payment source | Source: Ambulatory Visit | Attending: Radiation Oncology | Admitting: Radiation Oncology

## 2020-03-03 DIAGNOSIS — C4492 Squamous cell carcinoma of skin, unspecified: Secondary | ICD-10-CM | POA: Diagnosis not present

## 2020-03-06 ENCOUNTER — Ambulatory Visit
Admission: RE | Admit: 2020-03-06 | Discharge: 2020-03-06 | Disposition: A | Discharge status: Home / Self Care | Payer: No Typology Code available for payment source | Source: Ambulatory Visit | Attending: Radiation Oncology | Admitting: Radiation Oncology

## 2020-03-06 ENCOUNTER — Other Ambulatory Visit: Payer: Self-pay

## 2020-03-06 ENCOUNTER — Inpatient Hospital Stay: Payer: No Typology Code available for payment source

## 2020-03-06 DIAGNOSIS — C4492 Squamous cell carcinoma of skin, unspecified: Secondary | ICD-10-CM | POA: Diagnosis not present

## 2020-03-07 ENCOUNTER — Inpatient Hospital Stay: Payer: No Typology Code available for payment source

## 2020-03-07 ENCOUNTER — Ambulatory Visit
Admission: RE | Admit: 2020-03-07 | Discharge: 2020-03-07 | Disposition: A | Discharge status: Home / Self Care | Payer: No Typology Code available for payment source | Source: Ambulatory Visit | Attending: Radiation Oncology | Admitting: Radiation Oncology

## 2020-03-07 ENCOUNTER — Other Ambulatory Visit: Payer: Self-pay

## 2020-03-07 DIAGNOSIS — C4492 Squamous cell carcinoma of skin, unspecified: Secondary | ICD-10-CM | POA: Diagnosis not present

## 2020-03-08 ENCOUNTER — Other Ambulatory Visit: Payer: Self-pay

## 2020-03-08 ENCOUNTER — Ambulatory Visit
Admission: RE | Admit: 2020-03-08 | Discharge: 2020-03-08 | Disposition: A | Discharge status: Home / Self Care | Payer: No Typology Code available for payment source | Source: Ambulatory Visit | Attending: Radiation Oncology | Admitting: Radiation Oncology

## 2020-03-08 ENCOUNTER — Inpatient Hospital Stay: Payer: No Typology Code available for payment source

## 2020-03-08 DIAGNOSIS — C4492 Squamous cell carcinoma of skin, unspecified: Secondary | ICD-10-CM | POA: Diagnosis not present

## 2020-03-09 ENCOUNTER — Ambulatory Visit
Admission: RE | Admit: 2020-03-09 | Discharge: 2020-03-09 | Disposition: A | Discharge status: Home / Self Care | Payer: No Typology Code available for payment source | Source: Ambulatory Visit | Attending: Radiation Oncology | Admitting: Radiation Oncology

## 2020-03-09 ENCOUNTER — Inpatient Hospital Stay: Payer: No Typology Code available for payment source

## 2020-03-09 DIAGNOSIS — C4492 Squamous cell carcinoma of skin, unspecified: Secondary | ICD-10-CM | POA: Diagnosis not present

## 2020-03-09 LAB — CBC
HCT: 31.7 % — ABNORMAL LOW (ref 39.0–52.0)
Hemoglobin: 9.8 g/dL — ABNORMAL LOW (ref 13.0–17.0)
MCH: 33.4 pg (ref 26.0–34.0)
MCHC: 30.9 g/dL (ref 30.0–36.0)
MCV: 108.2 fL — ABNORMAL HIGH (ref 80.0–100.0)
Platelets: 112 10*3/uL — ABNORMAL LOW (ref 150–400)
RBC: 2.93 MIL/uL — ABNORMAL LOW (ref 4.22–5.81)
RDW: 23.3 % — ABNORMAL HIGH (ref 11.5–15.5)
WBC: 3.3 10*3/uL — ABNORMAL LOW (ref 4.0–10.5)
nRBC: 0 % (ref 0.0–0.2)

## 2020-03-10 ENCOUNTER — Inpatient Hospital Stay: Payer: No Typology Code available for payment source

## 2020-03-10 ENCOUNTER — Ambulatory Visit
Admission: RE | Admit: 2020-03-10 | Discharge: 2020-03-10 | Disposition: A | Discharge status: Home / Self Care | Payer: No Typology Code available for payment source | Source: Ambulatory Visit | Attending: Radiation Oncology | Admitting: Radiation Oncology

## 2020-03-10 DIAGNOSIS — C4492 Squamous cell carcinoma of skin, unspecified: Secondary | ICD-10-CM | POA: Diagnosis not present

## 2020-03-15 ENCOUNTER — Other Ambulatory Visit: Payer: Self-pay | Admitting: *Deleted

## 2020-03-15 DIAGNOSIS — C4492 Squamous cell carcinoma of skin, unspecified: Secondary | ICD-10-CM

## 2020-03-16 ENCOUNTER — Inpatient Hospital Stay: Payer: No Typology Code available for payment source

## 2020-03-16 DIAGNOSIS — C4492 Squamous cell carcinoma of skin, unspecified: Secondary | ICD-10-CM | POA: Diagnosis not present

## 2020-03-16 LAB — CBC
HCT: 29.8 % — ABNORMAL LOW (ref 39.0–52.0)
Hemoglobin: 9.5 g/dL — ABNORMAL LOW (ref 13.0–17.0)
MCH: 34.5 pg — ABNORMAL HIGH (ref 26.0–34.0)
MCHC: 31.9 g/dL (ref 30.0–36.0)
MCV: 108.4 fL — ABNORMAL HIGH (ref 80.0–100.0)
Platelets: 89 10*3/uL — ABNORMAL LOW (ref 150–400)
RBC: 2.75 MIL/uL — ABNORMAL LOW (ref 4.22–5.81)
RDW: 24.3 % — ABNORMAL HIGH (ref 11.5–15.5)
WBC: 3.3 10*3/uL — ABNORMAL LOW (ref 4.0–10.5)
nRBC: 0 % (ref 0.0–0.2)

## 2020-04-07 ENCOUNTER — Other Ambulatory Visit: Payer: Self-pay | Admitting: *Deleted

## 2020-04-13 ENCOUNTER — Other Ambulatory Visit: Payer: Self-pay

## 2020-04-13 ENCOUNTER — Inpatient Hospital Stay: Payer: No Typology Code available for payment source | Attending: Radiation Oncology

## 2020-04-13 ENCOUNTER — Ambulatory Visit
Admission: RE | Admit: 2020-04-13 | Discharge: 2020-04-13 | Disposition: A | Discharge status: Home / Self Care | Payer: No Typology Code available for payment source | Source: Ambulatory Visit | Attending: Radiation Oncology | Admitting: Radiation Oncology

## 2020-04-13 ENCOUNTER — Encounter: Payer: Self-pay | Admitting: Radiation Oncology

## 2020-04-13 VITALS — BP 131/51 | HR 71 | Temp 95.5°F | Resp 20 | Wt 132.0 lb

## 2020-04-13 DIAGNOSIS — C4492 Squamous cell carcinoma of skin, unspecified: Secondary | ICD-10-CM

## 2020-04-13 DIAGNOSIS — Z923 Personal history of irradiation: Secondary | ICD-10-CM | POA: Diagnosis not present

## 2020-04-13 DIAGNOSIS — Z85828 Personal history of other malignant neoplasm of skin: Secondary | ICD-10-CM | POA: Diagnosis present

## 2020-04-13 LAB — CBC
HCT: 26.7 % — ABNORMAL LOW (ref 39.0–52.0)
Hemoglobin: 8.5 g/dL — ABNORMAL LOW (ref 13.0–17.0)
MCH: 38.3 pg — ABNORMAL HIGH (ref 26.0–34.0)
MCHC: 31.8 g/dL (ref 30.0–36.0)
MCV: 120.3 fL — ABNORMAL HIGH (ref 80.0–100.0)
Platelets: 76 10*3/uL — ABNORMAL LOW (ref 150–400)
RBC: 2.22 MIL/uL — ABNORMAL LOW (ref 4.22–5.81)
RDW: 23.5 % — ABNORMAL HIGH (ref 11.5–15.5)
WBC: 3.1 10*3/uL — ABNORMAL LOW (ref 4.0–10.5)
nRBC: 0 % (ref 0.0–0.2)

## 2020-04-13 NOTE — Progress Notes (Signed)
Radiation Oncology Follow up Note  Name: Justin Schultz   Date:   04/13/2020 MRN:  [id number] DOB: 1933/03/15    This 86 y.o. 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  REFERRING PROVIDER: No ref. provider found  HPI: Patient is an 86 year old male now at 1 month having complete electron-beam treatment to his right ear for squamous cell carcinoma.  Seen today in routine follow-up he is doing well the lesion has completely responded with no evidence of disease.  No evidence of adenopathy in his neck.  He is accompanied by his daughter today.  COMPLICATIONS OF TREATMENT: none  FOLLOW UP COMPLIANCE: keeps appointments   PHYSICAL EXAM:  BP (!) 131/51 (BP Location: Right Arm, Patient Position: Sitting, Cuff Size: Normal)   Pulse 71   Temp (!) 95.5 F (35.3 C) (Tympanic)   Resp 20   Wt 132 lb (59.9 kg)   SpO2 98%  .  Area of the right ear shows no evidence of residual carcinoma no evidence of adenopathy in the cervical or supraclavicular region is noted.  Well-developed well-nourished patient in NAD. HEENT reveals PERLA, EOMI, discs not visualized.  Oral cavity is clear. No oral mucosal lesions are identified. Neck is clear without evidence of cervical or supraclavicular adenopathy. Lungs are clear to A&P. Cardiac examination is essentially unremarkable with regular rate and rhythm without murmur rub or thrill. Abdomen is benign with no organomegaly or masses noted. Motor sensory and DTR levels are equal and symmetric in the upper and lower extremities. Cranial nerves II through XII are grossly intact. Proprioception is intact. No peripheral adenopathy or edema is identified. No motor or sensory levels are noted. Crude visual fields are within normal range.  RADIOLOGY RESULTS: No current films for review  PLAN: Present time patient is an excellent complete response.  At this time we will continue  follow-up care at the [facility].  I be happy to reevaluate him at any time should any other skin lesions become evident.  Patient and family know to call with any concerns at any time.  I would like to take this opportunity to thank you for allowing me to participate in the care of your patient.Noreene Filbert, MD

## 2020-04-14 ENCOUNTER — Ambulatory Visit: Payer: No Typology Code available for payment source | Admitting: Radiation Oncology

## 2020-04-17 ENCOUNTER — Other Ambulatory Visit: Payer: Self-pay | Admitting: *Deleted

## 2020-04-20 ENCOUNTER — Inpatient Hospital Stay: Payer: No Typology Code available for payment source

## 2020-04-20 ENCOUNTER — Other Ambulatory Visit: Payer: Self-pay

## 2020-04-20 DIAGNOSIS — C4492 Squamous cell carcinoma of skin, unspecified: Secondary | ICD-10-CM

## 2020-04-20 LAB — CBC
HCT: 24 % — ABNORMAL LOW (ref 39.0–52.0)
Hemoglobin: 7.8 g/dL — ABNORMAL LOW (ref 13.0–17.0)
MCH: 39 pg — ABNORMAL HIGH (ref 26.0–34.0)
MCHC: 32.5 g/dL (ref 30.0–36.0)
MCV: 120 fL — ABNORMAL HIGH (ref 80.0–100.0)
Platelets: 81 10*3/uL — ABNORMAL LOW (ref 150–400)
RBC: 2 MIL/uL — ABNORMAL LOW (ref 4.22–5.81)
RDW: 22 % — ABNORMAL HIGH (ref 11.5–15.5)
WBC: 3.3 10*3/uL — ABNORMAL LOW (ref 4.0–10.5)
nRBC: 0 % (ref 0.0–0.2)

## 2020-04-25 ENCOUNTER — Other Ambulatory Visit: Payer: Self-pay | Admitting: *Deleted

## 2020-04-27 ENCOUNTER — Other Ambulatory Visit: Payer: Self-pay

## 2020-04-27 ENCOUNTER — Inpatient Hospital Stay: Payer: No Typology Code available for payment source

## 2020-04-27 DIAGNOSIS — C4492 Squamous cell carcinoma of skin, unspecified: Secondary | ICD-10-CM

## 2020-04-27 LAB — CBC
HCT: 26.9 % — ABNORMAL LOW (ref 39.0–52.0)
Hemoglobin: 8.9 g/dL — ABNORMAL LOW (ref 13.0–17.0)
MCH: 36.9 pg — ABNORMAL HIGH (ref 26.0–34.0)
MCHC: 33.1 g/dL (ref 30.0–36.0)
MCV: 111.6 fL — ABNORMAL HIGH (ref 80.0–100.0)
Platelets: 97 10*3/uL — ABNORMAL LOW (ref 150–400)
RBC: 2.41 MIL/uL — ABNORMAL LOW (ref 4.22–5.81)
WBC: 3.6 10*3/uL — ABNORMAL LOW (ref 4.0–10.5)
nRBC: 0 % (ref 0.0–0.2)

## 2020-04-28 ENCOUNTER — Other Ambulatory Visit: Payer: Self-pay | Admitting: *Deleted

## 2020-04-28 DIAGNOSIS — C4492 Squamous cell carcinoma of skin, unspecified: Secondary | ICD-10-CM

## 2020-05-04 ENCOUNTER — Other Ambulatory Visit: Payer: Self-pay

## 2020-05-04 ENCOUNTER — Inpatient Hospital Stay: Payer: No Typology Code available for payment source | Attending: Radiation Oncology

## 2020-05-04 DIAGNOSIS — C4492 Squamous cell carcinoma of skin, unspecified: Secondary | ICD-10-CM | POA: Insufficient documentation

## 2020-05-04 LAB — CBC
HCT: 25.3 % — ABNORMAL LOW (ref 39.0–52.0)
Hemoglobin: 8.2 g/dL — ABNORMAL LOW (ref 13.0–17.0)
MCH: 37.1 pg — ABNORMAL HIGH (ref 26.0–34.0)
MCHC: 32.4 g/dL (ref 30.0–36.0)
MCV: 114.5 fL — ABNORMAL HIGH (ref 80.0–100.0)
Platelets: 115 10*3/uL — ABNORMAL LOW (ref 150–400)
RBC: 2.21 MIL/uL — ABNORMAL LOW (ref 4.22–5.81)
WBC: 3.5 10*3/uL — ABNORMAL LOW (ref 4.0–10.5)
nRBC: 0 % (ref 0.0–0.2)

## 2020-05-11 ENCOUNTER — Inpatient Hospital Stay: Payer: No Typology Code available for payment source

## 2020-05-15 ENCOUNTER — Other Ambulatory Visit: Payer: Self-pay | Admitting: *Deleted

## 2020-05-18 ENCOUNTER — Other Ambulatory Visit: Payer: Self-pay

## 2020-05-18 ENCOUNTER — Inpatient Hospital Stay: Payer: No Typology Code available for payment source

## 2020-05-18 DIAGNOSIS — C4492 Squamous cell carcinoma of skin, unspecified: Secondary | ICD-10-CM

## 2020-05-18 LAB — CBC
HCT: 31.5 % — ABNORMAL LOW (ref 39.0–52.0)
Hemoglobin: 10.4 g/dL — ABNORMAL LOW (ref 13.0–17.0)
MCH: 33.9 pg (ref 26.0–34.0)
MCHC: 33 g/dL (ref 30.0–36.0)
MCV: 102.6 fL — ABNORMAL HIGH (ref 80.0–100.0)
Platelets: 83 10*3/uL — ABNORMAL LOW (ref 150–400)
RBC: 3.07 MIL/uL — ABNORMAL LOW (ref 4.22–5.81)
RDW: 24.9 % — ABNORMAL HIGH (ref 11.5–15.5)
WBC: 4.4 10*3/uL (ref 4.0–10.5)
nRBC: 0 % (ref 0.0–0.2)

## 2020-05-30 ENCOUNTER — Other Ambulatory Visit: Payer: Self-pay | Admitting: *Deleted

## 2020-06-01 ENCOUNTER — Inpatient Hospital Stay: Payer: No Typology Code available for payment source | Attending: Radiation Oncology

## 2020-06-01 ENCOUNTER — Other Ambulatory Visit: Payer: Self-pay

## 2020-06-01 DIAGNOSIS — C4492 Squamous cell carcinoma of skin, unspecified: Secondary | ICD-10-CM | POA: Insufficient documentation

## 2020-06-01 LAB — CBC
HCT: 26 % — ABNORMAL LOW (ref 39.0–52.0)
Hemoglobin: 8.6 g/dL — ABNORMAL LOW (ref 13.0–17.0)
MCH: 34 pg (ref 26.0–34.0)
MCHC: 33.1 g/dL (ref 30.0–36.0)
MCV: 102.8 fL — ABNORMAL HIGH (ref 80.0–100.0)
Platelets: 79 10*3/uL — ABNORMAL LOW (ref 150–400)
RBC: 2.53 MIL/uL — ABNORMAL LOW (ref 4.22–5.81)
WBC: 4.9 10*3/uL (ref 4.0–10.5)
nRBC: 0 % (ref 0.0–0.2)

## 2020-06-12 ENCOUNTER — Other Ambulatory Visit: Payer: Self-pay | Admitting: *Deleted

## 2020-06-15 ENCOUNTER — Inpatient Hospital Stay: Payer: No Typology Code available for payment source

## 2020-06-15 ENCOUNTER — Other Ambulatory Visit: Payer: Self-pay

## 2020-06-15 DIAGNOSIS — C4492 Squamous cell carcinoma of skin, unspecified: Secondary | ICD-10-CM

## 2020-06-15 LAB — CBC
HCT: 25.7 % — ABNORMAL LOW (ref 39.0–52.0)
Hemoglobin: 8.6 g/dL — ABNORMAL LOW (ref 13.0–17.0)
MCH: 33.1 pg (ref 26.0–34.0)
MCHC: 33.5 g/dL (ref 30.0–36.0)
MCV: 98.8 fL (ref 80.0–100.0)
Platelets: 158 10*3/uL (ref 150–400)
RBC: 2.6 MIL/uL — ABNORMAL LOW (ref 4.22–5.81)
RDW: 23.4 % — ABNORMAL HIGH (ref 11.5–15.5)
WBC: 6 10*3/uL (ref 4.0–10.5)
nRBC: 0 % (ref 0.0–0.2)

## 2020-06-19 ENCOUNTER — Other Ambulatory Visit: Payer: Self-pay | Admitting: *Deleted

## 2020-06-22 ENCOUNTER — Telehealth: Payer: Self-pay | Admitting: *Deleted

## 2020-06-22 ENCOUNTER — Inpatient Hospital Stay: Payer: No Typology Code available for payment source

## 2020-06-22 ENCOUNTER — Other Ambulatory Visit: Payer: Self-pay

## 2020-06-22 DIAGNOSIS — C4492 Squamous cell carcinoma of skin, unspecified: Secondary | ICD-10-CM | POA: Diagnosis not present

## 2020-06-22 LAB — CBC
HCT: 25.3 % — ABNORMAL LOW (ref 39.0–52.0)
Hemoglobin: 8.3 g/dL — ABNORMAL LOW (ref 13.0–17.0)
MCH: 32.8 pg (ref 26.0–34.0)
MCHC: 32.8 g/dL (ref 30.0–36.0)
MCV: 100 fL (ref 80.0–100.0)
Platelets: 122 10*3/uL — ABNORMAL LOW (ref 150–400)
RBC: 2.53 MIL/uL — ABNORMAL LOW (ref 4.22–5.81)
RDW: 23.6 % — ABNORMAL HIGH (ref 11.5–15.5)
WBC: 5.6 10*3/uL (ref 4.0–10.5)
nRBC: 0 % (ref 0.0–0.2)

## 2020-06-22 NOTE — Telephone Encounter (Signed)
Spoke with Ranelle Oyster, patient's daughter to review today's lab results. hgb 8.3.

## 2020-07-19 ENCOUNTER — Other Ambulatory Visit: Payer: Self-pay | Admitting: *Deleted

## 2020-07-19 DIAGNOSIS — C4492 Squamous cell carcinoma of skin, unspecified: Secondary | ICD-10-CM

## 2020-07-20 ENCOUNTER — Other Ambulatory Visit: Payer: Self-pay

## 2020-07-20 ENCOUNTER — Inpatient Hospital Stay: Payer: No Typology Code available for payment source | Attending: Radiation Oncology

## 2020-07-20 DIAGNOSIS — C4492 Squamous cell carcinoma of skin, unspecified: Secondary | ICD-10-CM | POA: Insufficient documentation

## 2020-07-20 LAB — CBC
HCT: 23.1 % — ABNORMAL LOW (ref 39.0–52.0)
Hemoglobin: 7.6 g/dL — ABNORMAL LOW (ref 13.0–17.0)
MCH: 31.4 pg (ref 26.0–34.0)
MCHC: 32.9 g/dL (ref 30.0–36.0)
MCV: 95.5 fL (ref 80.0–100.0)
Platelets: 159 10*3/uL (ref 150–400)
RBC: 2.42 MIL/uL — ABNORMAL LOW (ref 4.22–5.81)
RDW: 18.4 % — ABNORMAL HIGH (ref 11.5–15.5)
WBC: 6.7 10*3/uL (ref 4.0–10.5)
nRBC: 0 % (ref 0.0–0.2)

## 2020-07-27 ENCOUNTER — Inpatient Hospital Stay: Payer: No Typology Code available for payment source

## 2020-07-27 ENCOUNTER — Other Ambulatory Visit: Payer: Self-pay | Admitting: *Deleted

## 2020-07-27 ENCOUNTER — Other Ambulatory Visit: Payer: Self-pay

## 2020-07-27 DIAGNOSIS — C4492 Squamous cell carcinoma of skin, unspecified: Secondary | ICD-10-CM

## 2020-07-27 LAB — CBC
HCT: 25.8 % — ABNORMAL LOW (ref 39.0–52.0)
Hemoglobin: 8.5 g/dL — ABNORMAL LOW (ref 13.0–17.0)
MCH: 29.8 pg (ref 26.0–34.0)
MCHC: 32.9 g/dL (ref 30.0–36.0)
MCV: 90.5 fL (ref 80.0–100.0)
Platelets: 133 10*3/uL — ABNORMAL LOW (ref 150–400)
RBC: 2.85 MIL/uL — ABNORMAL LOW (ref 4.22–5.81)
RDW: 17.2 % — ABNORMAL HIGH (ref 11.5–15.5)
WBC: 5.4 10*3/uL (ref 4.0–10.5)
nRBC: 0 % (ref 0.0–0.2)

## 2020-10-30 DEATH — deceased
